# Patient Record
Sex: Female | Born: 1965 | Race: White | Hispanic: No | Marital: Married | Smoking: Never smoker
Health system: Southern US, Community
[De-identification: ages and names within clinical notes are randomized; demographics above are authoritative.]

## PROBLEM LIST (undated history)

## (undated) DIAGNOSIS — E039 Hypothyroidism, unspecified: Secondary | ICD-10-CM

## (undated) DIAGNOSIS — F419 Anxiety disorder, unspecified: Secondary | ICD-10-CM

## (undated) HISTORY — DX: Hypothyroidism, unspecified: E03.9

## (undated) HISTORY — DX: Anxiety disorder, unspecified: F41.9

---

## 2005-11-24 ENCOUNTER — Other Ambulatory Visit: Admission: RE | Admit: 2005-11-24 | Discharge: 2005-11-24 | Payer: Self-pay | Admitting: Obstetrics & Gynecology

## 2005-12-20 ENCOUNTER — Encounter: Admission: RE | Admit: 2005-12-20 | Discharge: 2005-12-20 | Payer: Self-pay | Admitting: Obstetrics & Gynecology

## 2005-12-27 ENCOUNTER — Encounter: Admission: RE | Admit: 2005-12-27 | Discharge: 2005-12-27 | Payer: Self-pay | Admitting: Obstetrics & Gynecology

## 2006-07-04 ENCOUNTER — Encounter: Admission: RE | Admit: 2006-07-04 | Discharge: 2006-07-04 | Payer: Self-pay | Admitting: Obstetrics & Gynecology

## 2006-12-05 ENCOUNTER — Other Ambulatory Visit: Admission: RE | Admit: 2006-12-05 | Discharge: 2006-12-05 | Payer: Self-pay | Admitting: Obstetrics & Gynecology

## 2007-01-07 ENCOUNTER — Encounter: Admission: RE | Admit: 2007-01-07 | Discharge: 2007-01-07 | Payer: Self-pay | Admitting: Obstetrics & Gynecology

## 2008-01-17 HISTORY — PX: ABDOMINAL HYSTERECTOMY: SHX81

## 2008-01-20 ENCOUNTER — Other Ambulatory Visit: Admission: RE | Admit: 2008-01-20 | Discharge: 2008-01-20 | Payer: Self-pay | Admitting: Obstetrics & Gynecology

## 2008-01-21 ENCOUNTER — Encounter: Admission: RE | Admit: 2008-01-21 | Discharge: 2008-01-21 | Payer: Self-pay | Admitting: Obstetrics & Gynecology

## 2008-03-17 ENCOUNTER — Encounter: Payer: Self-pay | Admitting: Obstetrics & Gynecology

## 2008-03-17 ENCOUNTER — Ambulatory Visit (HOSPITAL_COMMUNITY): Admission: RE | Admit: 2008-03-17 | Discharge: 2008-03-18 | Payer: Self-pay | Admitting: Obstetrics & Gynecology

## 2009-02-12 ENCOUNTER — Encounter: Admission: RE | Admit: 2009-02-12 | Discharge: 2009-02-12 | Payer: Self-pay | Admitting: Family Medicine

## 2010-04-28 LAB — BASIC METABOLIC PANEL
BUN: 5 mg/dL — ABNORMAL LOW (ref 6–23)
CO2: 29 mEq/L (ref 19–32)
Chloride: 102 mEq/L (ref 96–112)
Chloride: 106 mEq/L (ref 96–112)
Creatinine, Ser: 0.96 mg/dL (ref 0.4–1.2)
GFR calc Af Amer: >60 mL/min (ref >60)
Glucose, Bld: 112 mg/dL — ABNORMAL HIGH (ref 70–99)
Potassium: 4.1 mEq/L (ref 3.5–5.1)
Potassium: 4.4 mEq/L (ref 3.5–5.1)
Sodium: 131 mEq/L — ABNORMAL LOW (ref 135–145)

## 2010-04-28 LAB — TYPE AND SCREEN
ABO/RH(D): B POS
Antibody Screen: NEGATIVE

## 2010-04-28 LAB — URINALYSIS, ROUTINE W REFLEX MICROSCOPIC
Glucose, UA: NEGATIVE mg/dL
Hgb urine dipstick: NEGATIVE
Ketones, ur: NEGATIVE mg/dL
Protein, ur: NEGATIVE mg/dL

## 2010-04-28 LAB — CBC
HCT: 32.2 % — ABNORMAL LOW (ref 36.0–46.0)
MCV: 91.9 fL (ref 78.0–100.0)
MCV: 92.8 fL (ref 78.0–100.0)
Platelets: 178 10*3/uL (ref 150–400)
RBC: 4.65 MIL/uL (ref 3.87–5.11)
RDW: 12 % (ref 11.5–15.5)
WBC: 8.5 10*3/uL (ref 4.0–10.5)

## 2010-04-28 LAB — PROTIME-INR: Prothrombin Time: 12.9 seconds (ref 11.6–15.2)

## 2010-05-31 NOTE — Op Note (Signed)
NAME:  Brenda Miller, Brenda Miller                 ACCOUNT NO.:  [id number]   MEDICAL RECORD NO.:  [id number]          PATIENT TYPE:  OIB   LOCATION:  9318                          FACILITY:  [facility]   PHYSICIAN:  M. Leda Quail, MD  DATE OF BIRTH:  [birth year]   DATE OF PROCEDURE:  DATE OF DISCHARGE:                               OPERATIVE REPORT   PREOPERATIVE DIAGNOSES:  67. A 45 year old, G3, P2, married white female with menorrhagia.  2. Symptomatic uterine fibroids.  3. Stress urinary incontinence.  4. Hypothyroidism.   POSTOPERATIVE DIAGNOSES:  56. A 45 year old, G3, P2, married white female with menorrhagia.  2. Symptomatic uterine fibroids.  3. Stress urinary incontinence.  4. Hypothyroidism.  5. Left paratubal cyst.   PROCEDURE:  Laparoscopic-assisted vaginal hysterectomy with left  paratubal cyst removal and also a SPARC sling performed by Dr.  Sherron Monday and he will dictate this separately.   ANESTHESIA:  General endotracheal, Dr. Jean Rosenthal oversaw the case.   ESTIMATED BLOOD LOSS:  225.   FLUIDS:  2800 mL of LR.   URINE OUTPUT:  425 mL of clear urine.   SPECIMENS:  Uterus, cervix, and left peritubal cyst sent to Pathology.   SURGEON:  M. Leda Quail, MD   ASSISTANT:  Edwena Felty. Romine, MD   INDICATIONS:  Ms. Mcglade is a very nice 45 year old, G3, P2, married white  female who has a history of menorrhagia and symptomatic uterine  fibroids.  She has been on oral contraceptives with some improvement;  however, her ultimate desire has been to proceed with hysterectomy.  She  has waited until this time.  She also has history of stress urinary  incontinence and has been evaluated by Dr. Sherron Monday.  A mid urethral  sling was recommended after evaluation.  We decided to do this at a  combined procedure.  Risks and benefits from both procedures have been  explained to the patient.  In particular, the risk for hysterectomy and  possible ovary removal is documented in her office  chart.  The patient  is desires to proceed and presents for this today.   PROCEDURE:  The patient was taken to operating room.  She was placed in  supine position.  General endotracheal anesthesia was administered by  the anesthesia staff without difficulty.  She had a running IV in the  left hand.  Legs were positioned in the low lithotomy position.  Attention was paid to ensure there was no pressure on the back of the  calves or the lateral legs.  SCD devices were on the lower extremities  bilaterally.  The abdomen, perineum, inner thighs, and vagina were  prepped in normal sterile fashion.  Legs are positioned to the high  lithotomy position.  Attention was turned to the vagina.  A bivalve  speculum was placed in the vagina.  The anterior lip of the cervix was  grasped with a single-tooth tenaculum.  A Hulka clamp was passed through  the cervical canal and attached to the anterior lip of the cervix as a  means to manipulate the uterus during the procedure.  A  Foley catheter  was placed to straight drain.  The legs were positioned back in the low  lithotomy position.  The patient was draped in normal sterile fashion.   Attention was turned to the abdomen.  Marcaine 0.25% was instilled  beneath the umbilicus.  Using a #11 blade, a 10-mm incision was made  beneath the umbilicus.  The subcutaneous fat tissue was dissected.  The  Veress needle was obtained.  The patient's abdomen was elevated, and  Veress needle was aimed toward the pelvis.  Once the fascia and  peritoneum were popped through, a syringe of normal saline was obtained.  The saline was injected into the abdomen without any difficulty.  Aspiration was performed without blood or fluid being noted.  Fluid  drips easily into the Veress needle.  Then, CO2 gas attached to the  Veress needle, and pneumoperitoneum was achieved under low flow of  gasses.  Pressures were never higher than 8 mmHg as the pneumoperitoneum  was achieved.   Once 3 L of CO2 gas was in the abdomen, the Veress needle  was removed.  A 10-mm Optiview trocar and port were obtained.  These  were attached to the laparoscope.  Again, the abdominal wall layers  lifted and the trocar and port were aimed toward the pelvis.  The  abdominal wall layers were visualized as they were traversed.  Once  intraperitoneal placement was noted, the Optiview trocar was removed.  The laparoscope was used to confirm intraperitoneal placement.  CO2 gas  was then placed on high flow.  The uterus was mobile.  There were some  fibroids noted.  Ovaries appeared normal, there was a left paratubal  cyst.  The upper abdomen was normal.  Photo documentation of all this  was obtained.  The abdominal wall layer was transilluminated and port  placement sites for the right and left lower quadrant ports were  visualized.  The abdominal wall vasculature was identified to ensure no  vascular entry.  Marcaine 0.25% was used to anesthetize the skin.  The  #11 blades were used to make a 5-mm skin incisions.  Then, a #5 non-  bladed trocar and port were placed in the right and left lower  quadrants.  The trocars were removed.  Then, an endoscopic grasper and  an EnSeal device were obtained.  The patient's uterus was placed on  stretch to the left side.  The right utero-ovarian pedicle was serially  clamped, cauterized, and transected.  This was carried down until the  uterine ovarian pedicle was complete cauterized and incised.  The right  round ligament was then serially clamped, cauterized, and incised.  At  this point, the anterior and posterior leaves of the broad ligament were  opened.  Using endoscopic scissors and monopolar cautery as necessary,  the anterior leaf of the broad ligament was opened and carried to the  level of the internal os.  Posterior leaf of the broad ligament was then  opened up.  There was some bleeding noted, but this was made hemostatic  with monopolar  cautery.  Then, attention was turned to the left side.  Left utero-ovarian pedicle was serially clamped, cauterized, incised  with the EnSeal device.  The left round ligament was serially clamped,  cauterized, and incised.  Then, the beginning of the broad ligament was  clamped, cauterized, and incised.  The anterior leaf of the broad  ligament was opened and the peritoneum was incised.  This was carried  down to the  midline as well at the level of the internal os.  The  anterior peritoneum was elevated, and the pubovesical cervical fascia  was dissected using endoscopic scissors.  Monopolar cautery was used for  small vessel cauterization as necessary.  Of note, the ureters were then  identified at the very beginning of the case.  At this point, as we were  at the level of the uterine arteries bilaterally, decision was made to  proceed vaginally.   Attention was removed from the abdomen.  The ports were left in place.  The patient was taken out of some of the Trendelenburg positioning that  was placed after the 3 abdominal ports had been placed.  She was still  in some Trendelenburg.  Legs were lifted to the high lithotomy position.  Heavy weighted speculum was placed in the posterior aspect of the  vagina.  The cervix was grasped with a Jacobson tenaculum.  A 10 mL of  1% lidocaine mixed 1:1 with epinephrine (1:200,000 units) was instilled.  The posterior cul-de-sac was entered sharply.  Figure-of-eight suture of  0 Vicryl was used to attach posterior vaginal mucosa and peritoneum.  The uterosacral ligaments were clamped bilaterally, incised, and suture  ligated with #0 Vicryl using a Heaney stitch.  Then, the cervix was  incised starting at approximately the 3 o'clock position and carrying it  around to the 9 o'clock position anteriorly over the cervix.  This was  carried down basically to the level of the cervix to dissect this  tissue.  Then, using curved Mayo scissors, the  pubovesical cervical  fascia was dissected inferiorly.  Once a correct plane was identified,  the operator's index finger was passed easily through this with an open  Ray-Tec to connect the incision done laparoscopically.  A curved Deaver  retractor was placed in this incision.  The fundus of the uterus was  visualized to confirm the correct plane.  The cardinal ligaments were  then serially clamped, incised, and suture ligated with #0 Vicryl.  Then, the uterine arteries were clamped, cut, and suture ligated doubly  with a #0 Vicryl, and at this point there was just a strut of tissue on  each side which was incised with cautery.  At this point, the specimen  was removed vaginally.  The fallopian tube on the left side was easily  visualized and the paratubal cyst was in the vaginal opening.  This was  cauterized and excised and handed off as a pathology specimen.  There  was a small amount of bleeding on the one pedicle on the patient's left.  A figure-of-eight suture was used to make this hemostatic.  The cuff was  then run with a #0 Vicryl starting at the 2 o'clock position.  The  anterior mucosa and the anterior peritoneum were incorporated in a  stitch.  This stitch was carried all the way around the cervix using  running interlocking fashion and ending at 10 o'clock.  At this point,  the cuff was hemostatic.  The cuff was closed with 3 figure-of-eight  sutures of #0 Vicryl.  Attention was turned back to the abdomen.  Pneumoperitoneum was re-achieved.  The laparoscope was then used to  visualize the pelvis.  The patient was placed back in some super  Trendelenburg.  Using a Nezhat suction irrigator, pelvis was irrigated.  There was small amount of bleeding at the pedicle where the uterine  artery was.  Using EnSeal device, this was cauterized for hemostasis.  Then  along the cuff edge, there were 2 small areas of small leading  which were made hemostatic using monopolar cautery.  Ureter  peristalsis  was noted bilaterally.  The pneumoperitoneum was released, and the cuff  was well visualized as well as the pedicle of the uterine artery to  ensure that without pressures they were hemostatic.  This was watched  for at least a minute and no bleeding was noted.  At this point, the 2  inferior ports were removed under direct visualization of the  laparoscope.  The pneumoperitoneum was completely relieved.  CRNA gave  the patient several deep breaths, and the abdomen was compressed to get  all air out of the abdomen.  At this point, the midline port was  removed.  The skin was closed at the umbilicus with a subcuticular  stitch of 3-0 Vicryl.  Then, the skin was cleansed.  The Dermabond and  Steri-Strips were applied.  At this point, Dr. Sherron Monday was ready for  his portion of the procedure and continued.       Lum Keas, MD  Electronically Signed     MSM/MEDQ  D:  03/17/2008  T:  03/17/2008  Job:  [id number]

## 2010-05-31 NOTE — Op Note (Signed)
NAMEJILLIANA, Brenda Miller                 ACCOUNT NO.:  [id number]   MEDICAL RECORD NO.:  [id number]          PATIENT TYPE:  OIB   LOCATION:  9318                          FACILITY:  [facility]   PHYSICIAN:  Martina Sinner, MD DATE OF BIRTH:  14-Dec-1965   DATE OF PROCEDURE:  03/17/2008  DATE OF DISCHARGE:                               OPERATIVE REPORT   PREOPERATIVE DIAGNOSIS:  Stress urinary incontinence.   POSTOPERATIVE DIAGNOSIS:  Stress urinary incontinence.   It is Ardella Chhim, the surgery was a sling cystourethropexy plus  cystoscopy.   Ms. Haglund underwent a hysterectomy and fixation of the cuff by Dr.  Hyacinth Meeker.  She has stress incontinence that it to a sling.   Following her gynecological surgery, I scrubbed in.  Leg position was  good.  Two 1 cm incisions were made, 1 fingerbreadth above symphysis  pubis 1.5 cm lateral to the midline.  I made a 2 cm incision underlying  the mid urethra after instilling a lidocaine epinephrine mixture  approximately 7 mL.  She had some bulging of the mucosa underneath the  urethra on this she had a urethral diverticulum, but my opinion she did  not.  I made the incision appropriately deep and dissected sharply to  the urethrovesical angle bilaterally.   With the bladder empty, I passed a spark needle on top of along the back  symphysis pubis and onto the pulp of my index finger bilaterally.  I  cystoscoped the patient.  There is no injury to bladder or urethra and  ureters had good jets bilaterally.  There is no indentation of the  bladder with wiggling of the trocar.  With the bladder empty, I attached  the spark sling and brought up through the retropubic space.  I  tensioned over the fat part of a Kelly clamp using my usual technique.  I cut below the blue dots and removed the sheaths.  There was excellent  hypermobility with no spring back effect.  I was happy with the position  and tension of the sling.   There was little bit of oozing on the  left side.  I closed the anterior  vaginal wall of running 2-0 Vicryl on a CT by needle followed by 2  interrupted sutures.  I cut the sling below the skin in the suprapubic  area and closed the skin with one 4-0 Vicryl followed by Dermabond.   Foley catheter was draining blue urine at the end of the case.  Total  blood loss was approximately 50-100 mL.  Hopefully, this operation  reached treatment goal.           ______________________________  Martina Sinner, MD  Electronically Signed     SAM/MEDQ  D:  03/17/2008  T:  03/18/2008  Job:  [id number]

## 2010-06-03 NOTE — Discharge Summary (Signed)
NAME:  Brenda Miller, Brenda Miller                 ACCOUNT NO.:  [id number]   MEDICAL RECORD NO.:  [id number]          PATIENT TYPE:  OIB   LOCATION:  9318                          FACILITY:  [facility]   PHYSICIAN:  M. Leda Quail, MD  DATE OF BIRTH:  Jun 18, 1965   DATE OF ADMISSION:  03/17/2008  DATE OF DISCHARGE:  03/18/2008                               DISCHARGE SUMMARY   ADMISSION DIAGNOSES:  15. A 45 year old G3, P2 married white female with menorrhagia.  2. Symptomatic uterine fibroids.  3. Stress urinary incontinence.  4. Hypothyroidism.   DISCHARGE DIAGNOSES:  54. A 45 year old G3 P2 married white female with menorrhagia.  2. Symptomatic uterine fibroids.  3. Stress urinary incontinence.  4. Hypothyroidism.  5. Adenomyosis with uterine fibroids.   PROCEDURES:  Laparoscopic-assisted vaginal hysterectomy and left  paratubal cyst removal also mid urethral sling placed by Dr. Alfredo Martinez.   HISTORY OF PRESENT ILLNESS:  Written H and P is in the chart.   HOSPITAL COURSE:  The patient was admitted to same-day surgery.  She was  taken to the operating room where an LAVH was performed, a left  paratubal cyst was removed and also a mid urethral sling was placed by  Dr. Alfredo Martinez.  The patient had approximately 300 mL of blood  loss total for the surgery.  She did very well during the procedure, and  was very stable.  She had adequate urine output.  After an appropriate  time in the recovery room, she was transferred to the third floor for  the remainder of her hospitalization.  The patient was seen in the  evening of postoperative day 0.  She was doing very well.  She had  excellent pain control.  She was afebrile with stable vital signs and  made 2550 of urine output.  Her abdomen was soft and appropriately  tender with positive bowel sounds.  Incisions were clean, dry and  intact.  On the morning of postoperative day #1, postop hemoglobin was  10.9.  She did have vaginal pack that  was placed and this was removed.  Bladder trials were be begun after she was up to ambulate and tolerate a  regular diet.  She was able to have her IV out and transitioned to oral  pain medicines.  Other lab work showed a white count 9.7, platelet count  178.  Sodium 136, potassium 4.4, BUN and creatinine 5 and 0.7  respectively and glucose of 112.  She had remained stable, even has  normal vital signs and had made 7000 mL of clear urine.  Her exam was  completely benign.  The patient was able to ambulate without difficulty,  tolerate a regular diet transition to oral pain medicines.  She did have  trouble with her bladder trials and had postvoid residuals of greater  than 100 at each time.  Because of risk, she had been previously taught  in-and-out catheterizations and she was able to easily show, but this  are easily demonstrated to nursing staff that she could do in-and-out  cath without difficulty.  After she  demonstrated this several times the  decision was made to let her go ahead home.   FOLLOW UP:  She will be seen 1 week in the my office, in 2 weeks Dr.  Mina Marble office.   DISCHARGE MEDICATIONS:  She has prescriptions for pain medicine  including Percocet 5/325 one to two tablets p.o. q.4-6 h. p.r.n. pain  and Motrin 800 mg one every 8 hours as needed for pain.   PATHOLOGY:  Pathology report showed benign cervix, benign inactive  endometrium, adenomyosis, leiomyoma, and benign paratubal cyst.      Lum Keas, MD  Electronically Signed     MSM/MEDQ  D:  04/03/2008  T:  04/04/2008  Job:  [id number]

## 2010-06-03 NOTE — Discharge Summary (Signed)
NAME:  Brenda Miller, Brenda Miller                 ACCOUNT NO.:  [id number]   MEDICAL RECORD NO.:  [id number]          PATIENT TYPE:  OIB   LOCATION:  9318                          FACILITY:  [facility]   PHYSICIAN:  M. Leda Quail, MD  DATE OF BIRTH:  12/18/1965   DATE OF ADMISSION:  03/17/2008  DATE OF DISCHARGE:  03/18/2008                               DISCHARGE SUMMARY   ADMISSION DIAGNOSES:  30. A 45 year old G3, P2 married white female with menorrhagia.  2. Symptomatic uterine fibroids.  3. Stress urinary incontinence.  4. Hypothyroidism.  5. Adenomyosis on ultrasound.   DISCHARGE DIAGNOSES:  4. A 45 year old G3, P2 married white female with menorrhagia.  2. Symptomatic uterine fibroids.  3. Stress urinary incontinence.  4. Hypothyroidism.  5. Adenomyosis on ultrasound.  6. A left paratubal cyst.   PROCEDURE:  Laparoscopic-assisted vaginal hysterectomy with left  paratubal cyst removal and spark sling performed by Dr. Jacquelyne Balint.   HISTORY OF PRESENT ILLNESS:  A written H and P is in the chart but in  brief, Ms. Brenda Miller is a very nice 45 year old married white female who has  a history of symptomatic uterine fibroids, adenomyosis resulting in  menorrhagia.  She had been managed on oral contraceptives and has been  fairly well on these but does not want to be on oral contraceptives long  term.  She had originally hope for definitive management but due to  timing need to wait.  She has, in the interim, gone to see Dr. Jacquelyne Balint  for a workup of stress urinary incontinence and at this point is  planning a mid urethral sling procedure.  She would like at the same  time to have definitive management of her bleeding.  Preop evaluation  has been performed.  Risks and benefits have been explained to the  patient.  This is all documented in her office chart.   HOSPITAL COURSE:  The patient was admitted on the same day of surgery.  She was taken to operating room where an LAVH left paratubal cyst  removal and spark mid urethral sling was placed.  The patient did well  during surgery.  She had approximately 325 mL of blood loss.  After the  procedure was over, she was taken to the recovery room and from there to  the third floor for the remainder of her hospitalization.  The patient  was seen in the evening of postoperative day zero and was doing quite  well.  She had stable vital signs, was afebrile, and had an excellent  urine output.  She had good pain control with a PCA device.  She was  able to rest fairly well during the night.  In the morning of  postoperative day #1, was evaluated.  T-max of 98.2, pulse 76,  respirations 16-20, BP 101 to 112/64 to 79, with sating 100% on room  air.  She has made 7000 mL of clear urine output.  Postop labs showed a  white count 97, hemoglobin 10, plate count 161.  Electrolytes showed a  sodium 136, potassium 4.40.  BUN and creatinine 5 and  0.7 and then  glucose 112.  The patient at this point had already taken in liquids and  adding some regular food the evening before.  She was able to tolerate  breakfast very well and ambulated out difficulty.  Her IV was  discontinued and she was transitioned to oral pain medicines.  She had a  completely benign exam.  Her bladder trial was not as successful as  originally had been.  She had PVRs of greater than 400.  She had already  been taught in-and-out self-catheterizations and was able to demonstrate  this to the nursing staff on her own.  By the early afternoon, she had  met all criteria for discharge and discharge home with plans.   Postoperative pain medications include Percocet 5/325 one or two tablets  p.o., q.4-6 h. p.r.n. pain and Motrin 800 mg 1 every 8 hours as needed  for pain.   The patient will follow up with me in approximately 1 week and Dr.  Jacquelyne Balint in 2 weeks.  The patient has been provided discharge  instructions in a written and verbal form.  She will be discharged home  with  her husband in stable condition.      Lum Keas, MD  Electronically Signed     MSM/MEDQ  D:  03/20/2008  T:  03/21/2008  Job:  [id number]

## 2012-06-11 ENCOUNTER — Other Ambulatory Visit: Payer: Self-pay

## 2012-06-11 DIAGNOSIS — Z1231 Encounter for screening mammogram for malignant neoplasm of breast: Secondary | ICD-10-CM

## 2012-06-12 ENCOUNTER — Ambulatory Visit
Admission: RE | Admit: 2012-06-12 | Discharge: 2012-06-12 | Disposition: A | Discharge status: Home / Self Care | Payer: Managed Care, Other (non HMO) | Source: Ambulatory Visit

## 2012-06-12 DIAGNOSIS — Z1231 Encounter for screening mammogram for malignant neoplasm of breast: Secondary | ICD-10-CM

## 2013-09-16 ENCOUNTER — Other Ambulatory Visit: Payer: Self-pay

## 2013-09-16 DIAGNOSIS — Z1231 Encounter for screening mammogram for malignant neoplasm of breast: Secondary | ICD-10-CM

## 2013-09-24 ENCOUNTER — Ambulatory Visit
Admission: RE | Admit: 2013-09-24 | Discharge: 2013-09-24 | Disposition: A | Discharge status: Home / Self Care | Payer: Managed Care, Other (non HMO) | Source: Ambulatory Visit

## 2013-09-24 DIAGNOSIS — Z1231 Encounter for screening mammogram for malignant neoplasm of breast: Secondary | ICD-10-CM

## 2014-09-28 ENCOUNTER — Other Ambulatory Visit: Payer: Self-pay

## 2014-09-28 DIAGNOSIS — Z1231 Encounter for screening mammogram for malignant neoplasm of breast: Secondary | ICD-10-CM

## 2014-10-29 ENCOUNTER — Ambulatory Visit
Admission: RE | Admit: 2014-10-29 | Discharge: 2014-10-29 | Disposition: A | Discharge status: Home / Self Care | Payer: Managed Care, Other (non HMO) | Source: Ambulatory Visit

## 2014-10-29 DIAGNOSIS — Z1231 Encounter for screening mammogram for malignant neoplasm of breast: Secondary | ICD-10-CM

## 2015-02-26 HISTORY — PX: COLONOSCOPY: SHX174

## 2015-03-16 ENCOUNTER — Encounter: Payer: Self-pay | Admitting: Allergy and Immunology

## 2015-03-16 ENCOUNTER — Ambulatory Visit (INDEPENDENT_AMBULATORY_CARE_PROVIDER_SITE_OTHER): Payer: Managed Care, Other (non HMO) | Admitting: Allergy and Immunology

## 2015-03-16 VITALS — BP 102/66 | HR 80 | Temp 98.3°F | Resp 16 | Ht 67.32 in | Wt 192.7 lb

## 2015-03-16 DIAGNOSIS — T7800XA Anaphylactic reaction due to unspecified food, initial encounter: Secondary | ICD-10-CM | POA: Insufficient documentation

## 2015-03-16 MED ORDER — EPINEPHRINE 0.3 MG/0.3ML IJ SOAJ: INTRAMUSCULAR | Status: AC

## 2015-03-16 NOTE — Assessment & Plan Note (Signed)
The patient's history suggests shellfish allergy and positive skin test results today confirm this diagnosis.  Meticulous avoidance of shellfish (crustacean and mollusk) as discussed.  A prescription has been provided for epinephrine auto-injector 2 pack along with instructions for proper administration.  A food allergy action plan has been provided and discussed.  Medic Alert identification is recommended.

## 2015-03-16 NOTE — Patient Instructions (Addendum)
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  Meticulous avoidance of shellfish (crustacean and mollusk) as discussed.  A prescription has been provided for epinephrine auto-injector 2 pack along with instructions for proper administration.  A food allergy action plan has been provided and discussed.  Medic Alert identification is recommended.    Return in about 1 year (around 03/15/2016), or if symptoms worsen or fail to improve.

## 2015-03-16 NOTE — Progress Notes (Signed)
New Patient Note  RE: Brenda Miller MRN: [id number] DOB: 1965-02-04 Date of Office Visit: 03/16/2015  Referring provider: Richmond Campbell., PA-C Primary care provider: Lilia Argue  Chief Complaint: Allergic Reaction   History of present illness: HPI Comments: Brenda Miller is a 50 y.o. female who presents today for consultation of possible food allergy.  She reports that 10 days ago she consumed oysters and crab legs and approximately one hour later developed swelling of the tongue and difficulty swallowing.  She took diphenhydramine and the symptoms gradually resolved over the next 12 hours.  She denies cardiopulmonary, cutaneous, or other GI symptoms.  She did not seek medical attention.     Assessment and plan: Allergy with anaphylaxis due to food The patient's history suggests shellfish allergy and positive skin test results today confirm this diagnosis.  Meticulous avoidance of shellfish (crustacean and mollusk) as discussed.  A prescription has been provided for epinephrine auto-injector 2 pack along with instructions for proper administration.  A food allergy action plan has been provided and discussed.  Medic Alert identification is recommended.    Meds ordered this encounter  Medications  . EPINEPHrine (EPIPEN 2-PAK) 0.3 mg/0.3 mL IJ SOAJ injection    Sig: USE AS DIRECTED FOR LIFE THREATENING ALLERGIC REACTIONS    Dispense:  4 Device    Refill:  2    DISPENSE MYLAN BRAND ONLY-COUPON: RxBIN: [id number]  RxPCN: [id number]  RxGRP: [id number]  ISSUER: ([id number])  ID: [id number]    Diagnositics: Food allergen skin testing: Positive to oyster, lobster, and scallop.    Physical examination: Blood pressure 102/66, pulse 80, temperature 98.3 F (36.8 C), temperature source Oral, resp. rate 16, height 5' 7.32" (1.71 m), weight 192 lb 10.9 oz (87.4 kg).  General: Alert, interactive, in no acute distress. Lungs: Clear to auscultation without wheezing, rhonchi or  rales. CV: Normal S1, S2 without murmurs. Abdomen: Nondistended, nontender. Skin: Warm and dry, without lesions or rashes. Extremities:  No clubbing, cyanosis or edema. Neuro:   Grossly intact.  Review of systems:  Review of Systems  Constitutional: Negative for fever, chills and weight loss.  HENT: Negative for congestion and nosebleeds.   Eyes: Negative for blurred vision.  Respiratory: Negative for hemoptysis, shortness of breath and wheezing.   Cardiovascular: Negative for chest pain.  Gastrointestinal: Negative for diarrhea and constipation.  Genitourinary: Negative for dysuria.  Musculoskeletal: Negative for myalgias and joint pain.  Skin: Negative for itching and rash.  Neurological: Negative for dizziness.  Endo/Heme/Allergies: Negative for environmental allergies. Does not bruise/bleed easily.    Past medical history:  Past Medical History  Diagnosis Date  . Hypothyroidism   . Anxiety     Past surgical history:  Past Surgical History  Procedure Laterality Date  . Colonoscopy  02/26/2015  . Abdominal hysterectomy  01/2008    Bladder Sling same time    Family history: Family History  Problem Relation Age of Onset  . Asthma Brother     Social history: Social History   Social History  . Marital Status: Married    Spouse Name: N/A  . Number of Children: N/A  . Years of Education: N/A   Occupational History  . Not on file.   Social History Main Topics  . Smoking status: Never Smoker   . Smokeless tobacco: Not on file  . Alcohol Use: Not on file  . Drug Use: Not on file  . Sexual Activity: Not on file   Other Topics Concern  . Not  on file   Social History Narrative  . No narrative on file   Environmental History: The patient lives in a 52 year old house with hardwood floors throughout and central air/heat.  There are 2 dogs and 1 cat in the house, the cat has access to her bedroom.  She is a nonsmoker.    Medication List       This list is  accurate as of: 03/16/15  5:58 PM.  Always use your most recent med list.               EPINEPHrine 0.3 mg/0.3 mL Soaj injection  Commonly known as:  EPIPEN 2-PAK  USE AS DIRECTED FOR LIFE THREATENING ALLERGIC REACTIONS     levothyroxine 125 MCG tablet  Commonly known as:  SYNTHROID, LEVOTHROID  Take 125 mcg by mouth daily before breakfast.     LORazepam 0.5 MG tablet  Commonly known as:  ATIVAN  Take 0.5 mg by mouth as needed for anxiety.     venlafaxine XR 150 MG 24 hr capsule  Commonly known as:  EFFEXOR-XR  Take 150 mg by mouth daily with breakfast.        Known medication allergies: Allergies  Allergen Reactions  . Lidocaine-Epinephrine Other (See Comments)    Panic attack    I appreciate the opportunity to take part in this Haidyn's care. Please do not hesitate to contact me with questions.  Sincerely,   R. Jorene Guest, MD

## 2015-03-22 ENCOUNTER — Encounter: Payer: Self-pay | Admitting: *Deleted

## 2015-10-11 ENCOUNTER — Other Ambulatory Visit: Payer: Self-pay | Admitting: Family Medicine

## 2015-10-11 DIAGNOSIS — Z1231 Encounter for screening mammogram for malignant neoplasm of breast: Secondary | ICD-10-CM

## 2015-11-02 ENCOUNTER — Ambulatory Visit
Admission: RE | Admit: 2015-11-02 | Discharge: 2015-11-02 | Disposition: A | Discharge status: Home / Self Care | Payer: Managed Care, Other (non HMO) | Source: Ambulatory Visit | Attending: Family Medicine | Admitting: Family Medicine

## 2015-11-02 DIAGNOSIS — Z1231 Encounter for screening mammogram for malignant neoplasm of breast: Secondary | ICD-10-CM

## 2016-09-26 ENCOUNTER — Other Ambulatory Visit: Payer: Self-pay | Admitting: Family Medicine

## 2016-09-26 DIAGNOSIS — Z1231 Encounter for screening mammogram for malignant neoplasm of breast: Secondary | ICD-10-CM

## 2016-11-02 ENCOUNTER — Ambulatory Visit
Admission: RE | Admit: 2016-11-02 | Discharge: 2016-11-02 | Disposition: A | Discharge status: Home / Self Care | Payer: Managed Care, Other (non HMO) | Source: Ambulatory Visit | Attending: Family Medicine | Admitting: Family Medicine

## 2016-11-02 DIAGNOSIS — Z1231 Encounter for screening mammogram for malignant neoplasm of breast: Secondary | ICD-10-CM

## 2016-11-15 ENCOUNTER — Other Ambulatory Visit: Payer: Self-pay | Admitting: Family Medicine

## 2016-11-15 ENCOUNTER — Ambulatory Visit
Admission: RE | Admit: 2016-11-15 | Discharge: 2016-11-15 | Disposition: A | Discharge status: Home / Self Care | Payer: Managed Care, Other (non HMO) | Source: Ambulatory Visit | Attending: Family Medicine | Admitting: Family Medicine

## 2016-11-15 DIAGNOSIS — M545 Low back pain, unspecified: Secondary | ICD-10-CM

## 2018-01-22 ENCOUNTER — Other Ambulatory Visit: Payer: Self-pay | Admitting: Family Medicine

## 2018-01-22 DIAGNOSIS — Z1231 Encounter for screening mammogram for malignant neoplasm of breast: Secondary | ICD-10-CM

## 2018-01-23 ENCOUNTER — Ambulatory Visit
Admission: RE | Admit: 2018-01-23 | Discharge: 2018-01-23 | Disposition: A | Discharge status: Home / Self Care | Payer: Managed Care, Other (non HMO) | Source: Ambulatory Visit | Attending: Family Medicine | Admitting: Family Medicine

## 2018-01-23 DIAGNOSIS — Z1231 Encounter for screening mammogram for malignant neoplasm of breast: Secondary | ICD-10-CM

## 2019-01-21 ENCOUNTER — Other Ambulatory Visit: Payer: Self-pay | Admitting: Family Medicine

## 2019-01-21 DIAGNOSIS — Z1231 Encounter for screening mammogram for malignant neoplasm of breast: Secondary | ICD-10-CM

## 2019-02-07 ENCOUNTER — Ambulatory Visit
Admission: RE | Admit: 2019-02-07 | Discharge: 2019-02-07 | Disposition: A | Discharge status: Home / Self Care | Payer: Managed Care, Other (non HMO) | Source: Ambulatory Visit | Attending: Family Medicine | Admitting: Family Medicine

## 2019-02-07 ENCOUNTER — Other Ambulatory Visit: Payer: Self-pay

## 2019-02-07 DIAGNOSIS — Z1231 Encounter for screening mammogram for malignant neoplasm of breast: Secondary | ICD-10-CM

## 2020-02-18 ENCOUNTER — Other Ambulatory Visit: Payer: Self-pay | Admitting: Family Medicine

## 2020-02-18 DIAGNOSIS — Z1231 Encounter for screening mammogram for malignant neoplasm of breast: Secondary | ICD-10-CM

## 2020-04-07 ENCOUNTER — Ambulatory Visit: Payer: Managed Care, Other (non HMO)

## 2020-06-01 ENCOUNTER — Ambulatory Visit
Admission: RE | Admit: 2020-06-01 | Discharge: 2020-06-01 | Disposition: A | Discharge status: Home / Self Care | Payer: Managed Care, Other (non HMO) | Source: Ambulatory Visit | Attending: Family Medicine | Admitting: Family Medicine

## 2020-06-01 ENCOUNTER — Other Ambulatory Visit: Payer: Self-pay

## 2020-06-01 DIAGNOSIS — Z1231 Encounter for screening mammogram for malignant neoplasm of breast: Secondary | ICD-10-CM

## 2021-03-23 ENCOUNTER — Other Ambulatory Visit: Payer: Self-pay | Admitting: Family Medicine

## 2021-03-23 DIAGNOSIS — Z1231 Encounter for screening mammogram for malignant neoplasm of breast: Secondary | ICD-10-CM

## 2021-06-02 ENCOUNTER — Ambulatory Visit
Admission: RE | Admit: 2021-06-02 | Discharge: 2021-06-02 | Disposition: A | Discharge status: Home / Self Care | Payer: Managed Care, Other (non HMO) | Source: Ambulatory Visit | Attending: Family Medicine | Admitting: Family Medicine

## 2021-06-02 DIAGNOSIS — Z1231 Encounter for screening mammogram for malignant neoplasm of breast: Secondary | ICD-10-CM

## 2022-05-04 ENCOUNTER — Other Ambulatory Visit: Payer: Self-pay | Admitting: Family Medicine

## 2022-05-04 DIAGNOSIS — Z1231 Encounter for screening mammogram for malignant neoplasm of breast: Secondary | ICD-10-CM

## 2022-06-27 ENCOUNTER — Ambulatory Visit
Admission: RE | Admit: 2022-06-27 | Discharge: 2022-06-27 | Disposition: A | Discharge status: Home / Self Care | Payer: Managed Care, Other (non HMO) | Source: Ambulatory Visit | Attending: Family Medicine | Admitting: Family Medicine

## 2022-06-27 DIAGNOSIS — Z1231 Encounter for screening mammogram for malignant neoplasm of breast: Secondary | ICD-10-CM

## 2023-08-20 ENCOUNTER — Other Ambulatory Visit: Payer: Self-pay | Admitting: Family Medicine

## 2023-08-20 DIAGNOSIS — Z0000 Encounter for general adult medical examination without abnormal findings: Secondary | ICD-10-CM

## 2023-09-05 ENCOUNTER — Ambulatory Visit
Admission: RE | Admit: 2023-09-05 | Discharge: 2023-09-05 | Disposition: A | Discharge status: Home / Self Care | Source: Ambulatory Visit | Attending: Family Medicine | Admitting: Family Medicine

## 2023-09-05 DIAGNOSIS — Z0000 Encounter for general adult medical examination without abnormal findings: Secondary | ICD-10-CM

## 2023-09-15 IMAGING — MG MM DIGITAL SCREENING BILAT W/ TOMO AND CAD
8 series · 8 of 24 positions shown · non-contrast
Comparison: Previous exam(s).

CLINICAL DATA: Screening.

EXAM:
DIGITAL SCREENING BILATERAL MAMMOGRAM WITH TOMOSYNTHESIS AND CAD
TECHNIQUE: Bilateral screening digital craniocaudal and mediolateral oblique
mammograms were obtained. Bilateral screening digital breast
tomosynthesis was performed. The images were evaluated with
computer-aided detection.

[L MLO synth-2D]
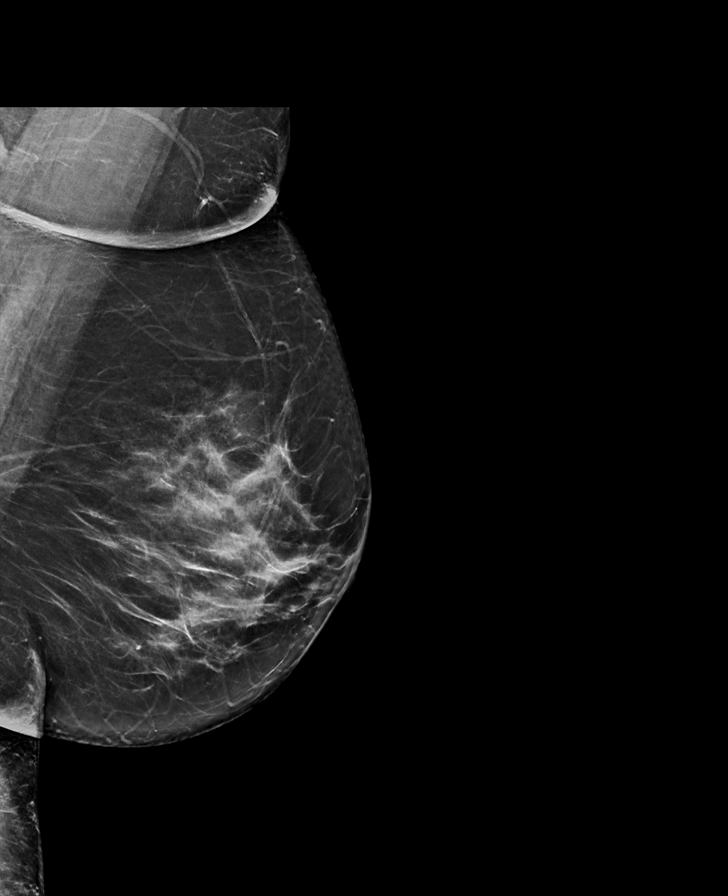

[R MLO synth-2D]
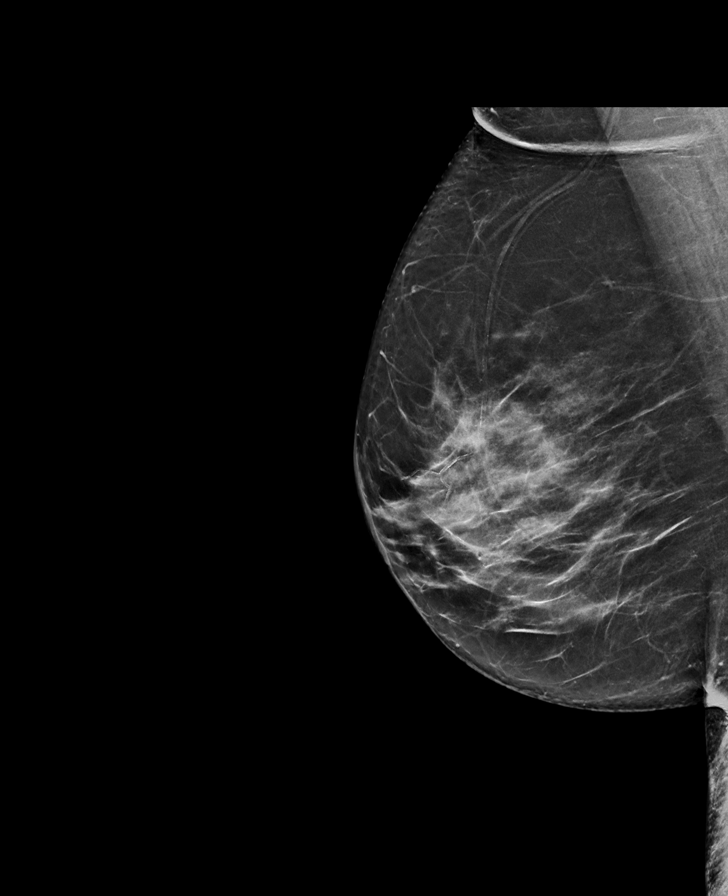

[L CC synth-2D]
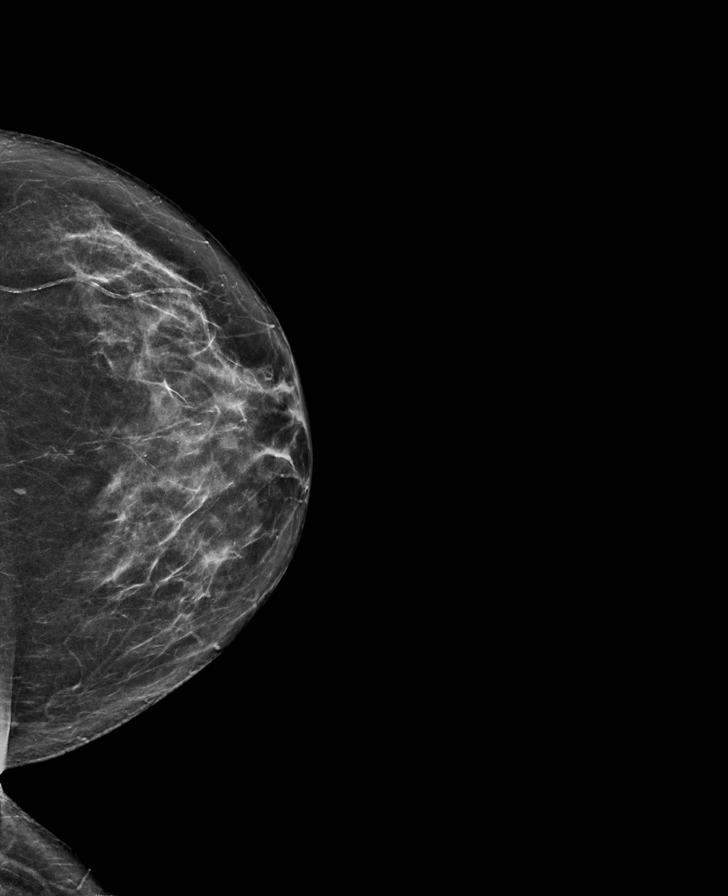

[R CC synth-2D]
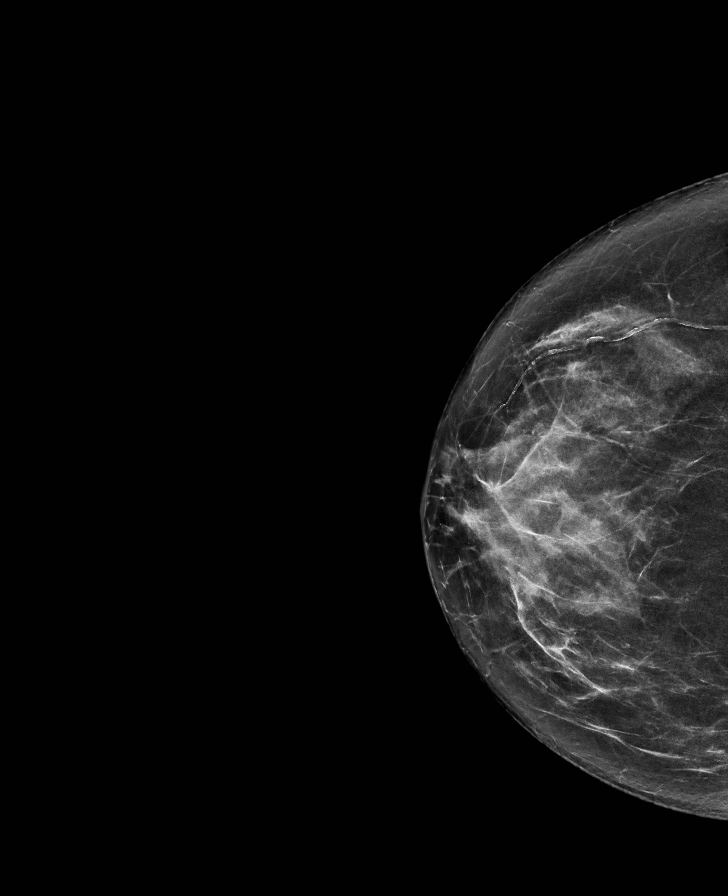

[R MLO tomo · tomo slice 37/72.0]
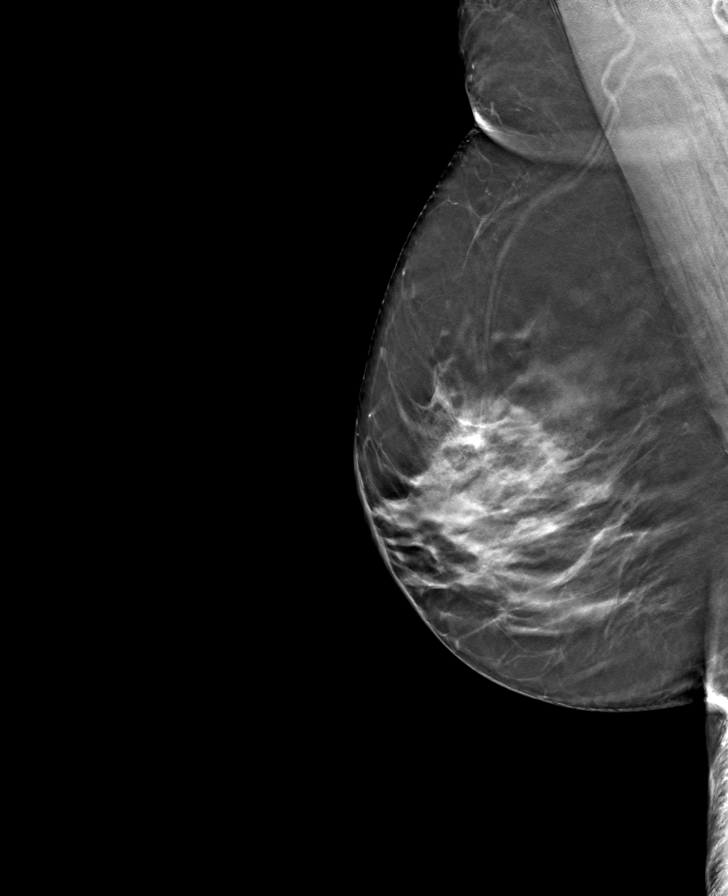

[L MLO tomo · tomo slice 39/77.0]
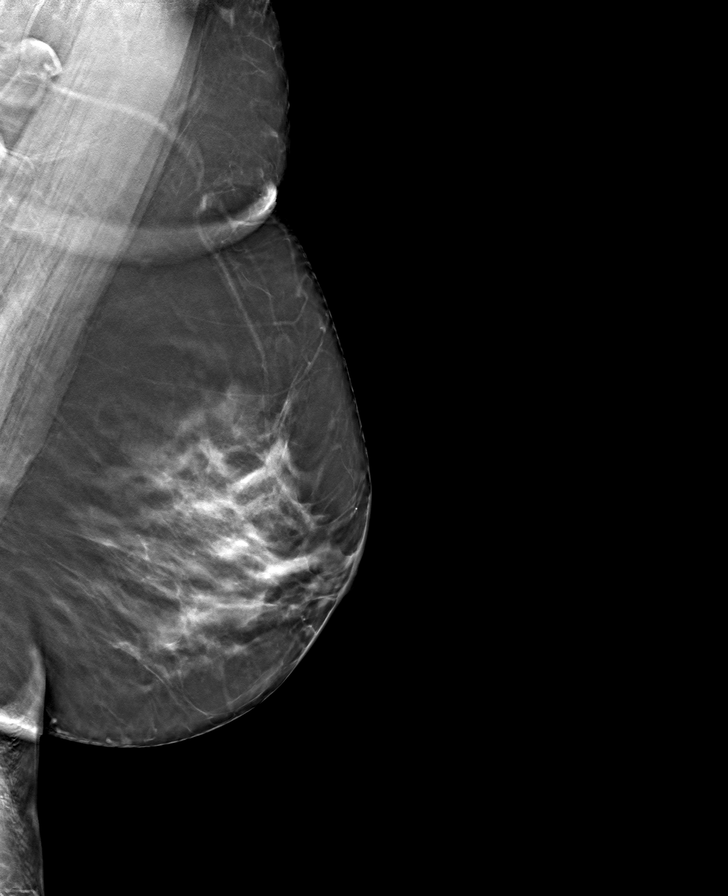

[L CC tomo · tomo slice 35/68.0]
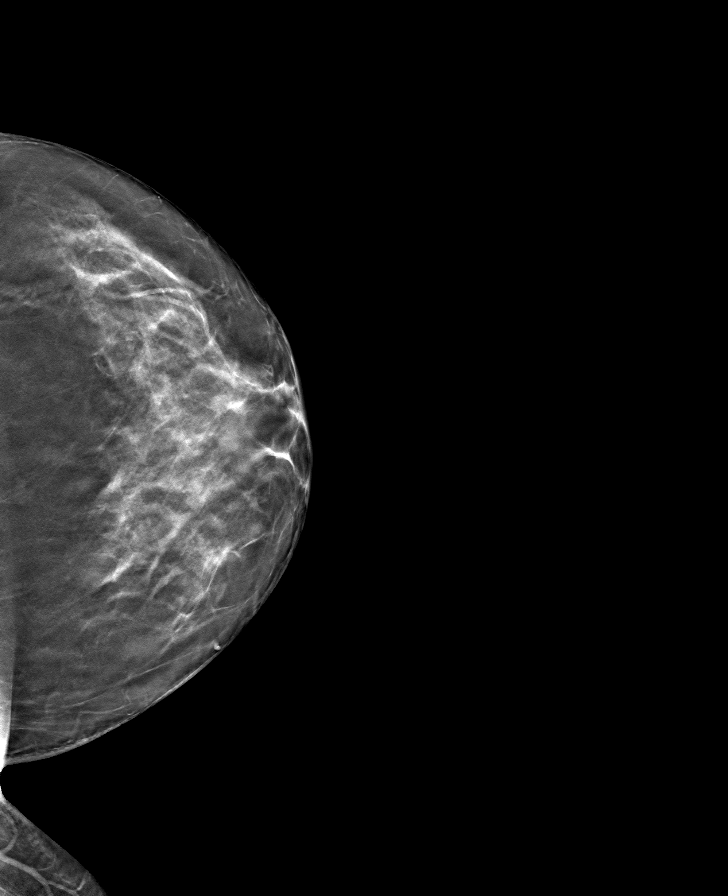

[R CC tomo · tomo slice 35/69.0]
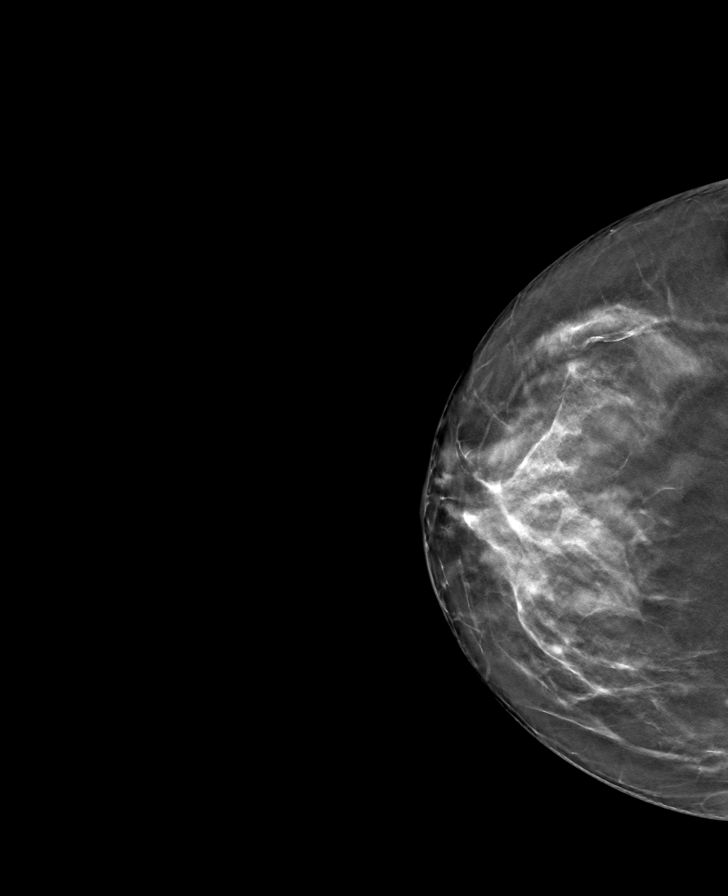

[8 of 24 positions shown; findings below may reference images not displayed]

ACR Breast Density Category c: The breast tissue is heterogeneously
dense, which may obscure small masses.
FINDINGS: There are no findings suspicious for malignancy.
IMPRESSION: No mammographic evidence of malignancy. A result letter of this
screening mammogram will be mailed directly to the patient.

RECOMMENDATION:
Screening mammogram in one year. (Code:Q3-W-BC3)

BI-RADS CATEGORY  1: Negative.
